# Patient Record
Sex: Female | Born: 1970 | Race: Black or African American | Hispanic: No | Marital: Single | Smoking: Never smoker
Health system: Southern US, Community
[De-identification: ages and names within clinical notes are randomized; demographics above are authoritative.]

## PROBLEM LIST (undated history)

## (undated) DIAGNOSIS — R000 Tachycardia, unspecified: Secondary | ICD-10-CM

## (undated) DIAGNOSIS — R079 Chest pain, unspecified: Secondary | ICD-10-CM

## (undated) DIAGNOSIS — I10 Essential (primary) hypertension: Secondary | ICD-10-CM

## (undated) DIAGNOSIS — K219 Gastro-esophageal reflux disease without esophagitis: Secondary | ICD-10-CM

## (undated) HISTORY — PX: REDUCTION MAMMAPLASTY: SUR839

## (undated) HISTORY — DX: Essential (primary) hypertension: I10

## (undated) HISTORY — DX: Tachycardia, unspecified: R00.0

## (undated) HISTORY — DX: Gastro-esophageal reflux disease without esophagitis: K21.9

## (undated) HISTORY — DX: Chest pain, unspecified: R07.9

---

## 1998-04-03 ENCOUNTER — Other Ambulatory Visit: Admission: RE | Admit: 1998-04-03 | Discharge: 1998-04-03 | Payer: Self-pay | Admitting: Obstetrics and Gynecology

## 1998-08-15 ENCOUNTER — Inpatient Hospital Stay (HOSPITAL_COMMUNITY): Admission: AD | Admit: 1998-08-15 | Discharge: 1998-08-15 | Payer: Self-pay | Admitting: Obstetrics & Gynecology

## 1998-08-29 ENCOUNTER — Inpatient Hospital Stay (HOSPITAL_COMMUNITY): Admission: AD | Admit: 1998-08-29 | Discharge: 1998-09-01 | Payer: Self-pay | Admitting: Obstetrics and Gynecology

## 1998-09-08 ENCOUNTER — Inpatient Hospital Stay (HOSPITAL_COMMUNITY): Admission: AD | Admit: 1998-09-08 | Discharge: 1998-09-11 | Payer: Self-pay | Admitting: Internal Medicine

## 1999-03-12 ENCOUNTER — Other Ambulatory Visit: Admission: RE | Admit: 1999-03-12 | Discharge: 1999-03-12 | Payer: Self-pay | Admitting: Gynecology

## 2001-02-20 ENCOUNTER — Encounter: Payer: Self-pay | Admitting: Family Medicine

## 2001-02-20 ENCOUNTER — Encounter: Admission: RE | Admit: 2001-02-20 | Discharge: 2001-02-20 | Payer: Self-pay | Admitting: Family Medicine

## 2001-03-27 ENCOUNTER — Encounter: Admission: RE | Admit: 2001-03-27 | Discharge: 2001-03-27 | Payer: Self-pay | Admitting: Neurosurgery

## 2001-03-27 ENCOUNTER — Encounter: Payer: Self-pay | Admitting: Neurosurgery

## 2001-04-13 ENCOUNTER — Encounter: Admission: RE | Admit: 2001-04-13 | Discharge: 2001-04-13 | Payer: Self-pay | Admitting: Neurosurgery

## 2001-04-13 ENCOUNTER — Encounter: Payer: Self-pay | Admitting: Neurosurgery

## 2001-07-26 ENCOUNTER — Encounter: Admission: RE | Admit: 2001-07-26 | Discharge: 2001-07-26 | Payer: Self-pay | Admitting: *Deleted

## 2001-07-26 ENCOUNTER — Encounter: Payer: Self-pay | Admitting: *Deleted

## 2003-04-15 ENCOUNTER — Emergency Department (HOSPITAL_COMMUNITY): Admission: AD | Admit: 2003-04-15 | Discharge: 2003-04-15 | Payer: Self-pay | Admitting: Family Medicine

## 2003-05-14 ENCOUNTER — Other Ambulatory Visit: Admission: RE | Admit: 2003-05-14 | Discharge: 2003-05-14 | Payer: Self-pay | Admitting: Obstetrics and Gynecology

## 2003-06-16 ENCOUNTER — Ambulatory Visit (HOSPITAL_BASED_OUTPATIENT_CLINIC_OR_DEPARTMENT_OTHER): Admission: RE | Admit: 2003-06-16 | Discharge: 2003-06-16 | Payer: Self-pay | Admitting: Specialist

## 2003-06-16 ENCOUNTER — Encounter (INDEPENDENT_AMBULATORY_CARE_PROVIDER_SITE_OTHER): Payer: Self-pay | Admitting: *Deleted

## 2003-06-16 ENCOUNTER — Ambulatory Visit (HOSPITAL_COMMUNITY): Admission: RE | Admit: 2003-06-16 | Discharge: 2003-06-16 | Payer: Self-pay | Admitting: Specialist

## 2004-06-04 ENCOUNTER — Encounter: Admission: RE | Admit: 2004-06-04 | Discharge: 2004-06-04 | Payer: Self-pay | Admitting: Obstetrics and Gynecology

## 2004-06-18 ENCOUNTER — Encounter: Admission: RE | Admit: 2004-06-18 | Discharge: 2004-06-18 | Payer: Self-pay | Admitting: Obstetrics and Gynecology

## 2005-07-15 ENCOUNTER — Encounter: Admission: RE | Admit: 2005-07-15 | Discharge: 2005-07-15 | Payer: Self-pay | Admitting: Obstetrics and Gynecology

## 2007-04-13 ENCOUNTER — Encounter: Payer: Self-pay | Admitting: Orthopedic Surgery

## 2007-04-15 ENCOUNTER — Encounter: Payer: Self-pay | Admitting: Orthopedic Surgery

## 2007-05-13 ENCOUNTER — Encounter: Payer: Self-pay | Admitting: Orthopedic Surgery

## 2008-12-19 ENCOUNTER — Encounter: Admission: RE | Admit: 2008-12-19 | Discharge: 2008-12-19 | Payer: Self-pay | Admitting: Obstetrics and Gynecology

## 2009-04-13 ENCOUNTER — Ambulatory Visit (HOSPITAL_COMMUNITY): Admission: RE | Admit: 2009-04-13 | Discharge: 2009-04-13 | Payer: Self-pay | Admitting: Orthopedic Surgery

## 2010-01-22 ENCOUNTER — Emergency Department (HOSPITAL_COMMUNITY): Admission: EM | Admit: 2010-01-22 | Discharge: 2010-01-22 | Payer: Self-pay | Admitting: Family Medicine

## 2010-07-30 NOTE — Op Note (Signed)
NAME:  PHALA, SCHRAEDER                           ACCOUNT NO.:  [id number]   MEDICAL RECORD NO.:  [id number]                   PATIENT TYPE:  AMB   LOCATION:  DSC                                  FACILITY:  [facility]   PHYSICIAN:  Earvin Hansen L. Shon Hough, M.D.           DATE OF BIRTH:  [birth year]   DATE OF PROCEDURE:  06/16/2003  DATE OF DISCHARGE:                                 OPERATIVE REPORT   SURGEON:  Earvin Hansen L. Shon Hough, M.D.   ASSISTANT:  Frances Nickels, R.N.   ANESTHESIA:  General.   INDICATIONS FOR PROCEDURE:  The patient demonstrates severe macromastia,  with back and shoulder pain secondary to large pendulous breasts, with  increase in accessory breast tissue.  Increased back and shoulder pain,  pitting of both shoulders, as well as intertriginous changes under both  breast areas.   PROCEDURE:  Bilateral breast reductions using the inferior pedicle  technique.   DESCRIPTION OF PROCEDURE:  Preoperatively the patient was drawn for the  inferior pedicle technique, remarking the nipple/areolar complex.  It was  back up to 20 cm from the suprasternal notch.  She then underwent general  anesthesia and was intubated orally.  A prep was done to the chest and  breast areas in a routine fashion using Betadine soap and solution, and  walled off with sterile towels and drapes, so as to make a sterile field.  Xylocaine 0.25% with epinephrine was injected, 1:400,000 concentration, a  total of 150 mL per side.  The wounds were scored with #15 blades, and then  the skin over the inferior pedicle was degrees-epithelialized with #20  blades.  Medial and lateral fatty dermal pedicles were excised down to the  underlying pectoralis major fascia.  Out laterally more tissue was removed.  Accessory breast tissue excision.  After this the new key hole area was  debulked after the flaps were stabilized and hemostasis.  They were stayed  with #3-0 Prolene, except cutaneous closures with  a run#2-0  Monocryl x2  levels, and then a running subcuticular stitch of #3-0 Monocryl.  The new  key hole area was also closed likewise with #5-0 Monocryl subcuticular.  The  wounds were drained with #10 Blake drains and fully fluted, and placed in  the depths of the wound and brought out through the lateral-most portion of  the incisions and secured with #3-0 Prolene.  Closure was done.  Steri-  Strips, Xeroform, 4 x 4's, ABD's and Hypafix tape.  The estimated blood loss  was less than 150 mL.   COMPLICATIONS:  None.                                               Yaakov Guthrie. Shon Hough, M.D.    Cathie Hoops  D:  06/16/2003  T:  06/16/2003  Job:  [id number]

## 2010-08-20 ENCOUNTER — Other Ambulatory Visit: Payer: Self-pay | Admitting: Obstetrics and Gynecology

## 2010-09-20 ENCOUNTER — Other Ambulatory Visit: Payer: Self-pay | Admitting: Obstetrics and Gynecology

## 2010-09-20 DIAGNOSIS — Z1231 Encounter for screening mammogram for malignant neoplasm of breast: Secondary | ICD-10-CM

## 2010-10-20 ENCOUNTER — Ambulatory Visit
Admission: RE | Admit: 2010-10-20 | Discharge: 2010-10-20 | Disposition: A | Discharge status: Home / Self Care | Payer: BC Managed Care – PPO | Source: Ambulatory Visit | Attending: Obstetrics and Gynecology | Admitting: Obstetrics and Gynecology

## 2010-10-20 DIAGNOSIS — Z1231 Encounter for screening mammogram for malignant neoplasm of breast: Secondary | ICD-10-CM

## 2011-06-10 ENCOUNTER — Emergency Department: Payer: Self-pay | Admitting: Unknown Physician Specialty

## 2011-06-10 LAB — CBC
MCH: 30.4 pg (ref 26.0–34.0)
MCHC: 33.8 g/dL (ref 32.0–36.0)
Platelet: 303 10*3/uL (ref 150–440)
RDW: 13 % (ref 11.5–14.5)
WBC: 5.7 10*3/uL (ref 3.6–11.0)

## 2011-06-10 LAB — URINALYSIS, COMPLETE
Bilirubin,UR: NEGATIVE
RBC,UR: 4 /HPF (ref 0–5)
Specific Gravity: 1.016 (ref 1.003–1.030)
Squamous Epithelial: 3

## 2011-06-10 LAB — COMPREHENSIVE METABOLIC PANEL
Alkaline Phosphatase: 49 U/L — ABNORMAL LOW (ref 50–136)
BUN: 9 mg/dL (ref 7–18)
Calcium, Total: 8.5 mg/dL (ref 8.5–10.1)
Creatinine: 0.64 mg/dL (ref 0.60–1.30)
EGFR (African American): >60
Potassium: 3.4 mmol/L — ABNORMAL LOW (ref 3.5–5.1)
SGOT(AST): 13 U/L — ABNORMAL LOW (ref 15–37)
Sodium: 137 mmol/L (ref 136–145)
Total Protein: 8 g/dL (ref 6.4–8.2)

## 2011-06-10 LAB — CK TOTAL AND CKMB (NOT AT ARMC)
CK, Total: 47 U/L (ref 21–215)
CK-MB: 0.6 ng/mL (ref 0.5–3.6)

## 2011-06-10 LAB — TROPONIN I: Troponin-I: <0.02 ng/mL

## 2011-06-11 LAB — DRUG SCREEN, URINE
Amphetamines, Ur Screen: NEGATIVE (ref ?–1000)
Cannabinoid 50 Ng, Ur ~~LOC~~: NEGATIVE (ref ?–50)
Cocaine Metabolite,Ur ~~LOC~~: NEGATIVE (ref ?–300)
MDMA (Ecstasy)Ur Screen: NEGATIVE (ref ?–500)

## 2011-06-11 LAB — CK TOTAL AND CKMB (NOT AT ARMC): CK-MB: <0.5 ng/mL — ABNORMAL LOW (ref 0.5–3.6)

## 2011-06-12 LAB — URINE CULTURE

## 2011-06-16 LAB — CULTURE, BLOOD (SINGLE)

## 2012-10-17 ENCOUNTER — Other Ambulatory Visit: Payer: Self-pay

## 2012-10-17 DIAGNOSIS — Z1231 Encounter for screening mammogram for malignant neoplasm of breast: Secondary | ICD-10-CM

## 2012-10-30 ENCOUNTER — Ambulatory Visit
Admission: RE | Admit: 2012-10-30 | Discharge: 2012-10-30 | Disposition: A | Discharge status: Home / Self Care | Payer: BC Managed Care – PPO | Source: Ambulatory Visit

## 2012-10-30 DIAGNOSIS — Z1231 Encounter for screening mammogram for malignant neoplasm of breast: Secondary | ICD-10-CM

## 2014-07-06 NOTE — Consult Note (Signed)
PATIENT NAME:  Sheila Baldwin, Sheila Baldwin MR#:  [id number] DATE OF BIRTH:  [birth year]  DATE OF CONSULTATION:  06/11/2011  REFERRING PHYSICIAN:  Dr. Margarita Grizzle  CONSULTING PHYSICIAN:  Stephanie Acre, MD  PRIMARY CARE PHYSICIAN: None.   CHIEF COMPLAINT: "Chest pain with inspiration".   HISTORY OF PRESENT ILLNESS: This is a 44 year old female with past medical history significant only for chronic back pain secondary to herniated disk who presented with acute onset of chest pain that started over the last 24 hours. The patient states that over the past couple of days to weeks she has been feeling fine. She has not had any kind of chest pain symptoms in the past before until last night and during the day today she started having some chest pains only with inspiration that would come and go. It started getting worse towards the evening especially with deep inspiration and with coughing. She tried to cough. The patient stated the pain is somewhat sharp especially with pushing down on her chest with deep inspiration kind of radiating to her back. In the ED she had a D-dimer that was mildly elevated 1.1 and a CT scan of the chest with contrast that showed no acute PE or any thoracic dissection ascending or descending. She denies any sick contacts. She does have subjective fevers and chills. No significant weight loss. She denies any family history of MI or coronary artery disease. Her mother does have diabetes but no other history of hypertension or significant risk factors for acute coronary syndrome, myocardial infarction, or angina. The patient also stated that she's been having body aches all over for the last 24 hours. 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 She is not sure if she's been exposed to any of her patients that may have had some type of viral syndrome. Hospitalist services were consulted for further recommendations and management.   PAST MEDICAL HISTORY:  Chronic back pain secondary to herniated disk.  MEDICATIONS: Flexeril. The patient cannot remember the dose. She doesn't take it frequently.  PAST SURGICAL HISTORY: None.  ALLERGIES: None.  LAST HOSPITALIZATION: None.  FAMILY HISTORY: Mother with diabetes.   SOCIAL HISTORY: Nonsmoker, nondrinker. No drugs.  REVIEW OF SYSTEMS: Positive fever, fatigue, weakness, and generalized body aches. No weight gain or weight loss. EYES: No blurred vision, double vision, or pain. ENT: No tinnitus, ear pain, hearing loss, postnasal drip. No cough. RESPIRATORY: No cough, no wheeze, no hemoptysis. CARDIOVASCULAR: Positive pleuritic chest pain. No syncope, high blood pressure, arrhythmia. GI: Mild nausea. No vomiting, diarrhea, abdominal pain, gastroesophageal reflux disease. GU: No dysuria or hematuria. ENDOCRINE: No polyuria, nocturia, thyroid problems.  HEME/LYMPH: No anemia, easy bruising, or swollen glands. MUSCULOSKELETAL: Chronic low back pain secondary to herniated disk, otherwise, generalized body aches. NEUROLOGIC: No numbness, weakness, dysarthria, epilepsy, tremor, or vertigo. PSYCH: No anxiety, insomnia, ADD, OCD, bipolar.   PHYSICAL EXAMINATION:  VITAL SIGNS: Temperature 100.2, pulse 125, respirations 20, blood pressure 129/81.   GENERAL APPEARANCE: Well developed, well nourished female laying in bed in no acute respiratory distress.   HEENT: Pupils equal, round, and reactive to light and accommodation. Extraocular movements intact. No scleral icterus. No difficulty hearing. TMs are intact. Mild pharyngeal erythema is noted. Mucous membranes are mildly dry. Dentition is good.   NECK: No thyroid enlargement. No tenderness. No nodules. Neck is supple, nontender. No JVD or carotid bruits.  RESPIRATORY: Clear to auscultation bilaterally. No rales, rhonchi, crackles, diminished  breath sounds. No labored breathing. No use of accessory muscles. During auscultation respiratory effort is somewhat poor  secondary to pain on deep inspiration. Reproducible chest pain with substernal palpation.  CARDIOVASCULAR: Regular rate, regular rhythm. No murmurs, rubs, or gallops. No pedal edema. No thrills. Again, reproducible chest pain.  ABDOMEN: Soft, nontender, and nondistended. Positive bowel sounds.  MUSCULOSKELETAL: 5 out of 5 strength in bilateral upper and lower extremities. No cyanosis. No degenerative joint disease.   SKIN: No rash, lesions, erythema, nodules. Skin is warm and dry.  LYMPH: No adenopathy noted in the cervical, axilla, or supraclavicular regions.  NEUROLOGIC: Cranial nerves II through XII intact. Deep tendon reflexes intact. No dysarthria, aphasia, dysphagia, or contractures.   PSYCH: Alert and oriented, cooperative, good judgment.   LABORATORY, DIAGNOSTIC, AND RADIOLOGICAL DATA: Sodium 137, potassium 3.4, bicarb 25, chloride 102, creatinine 0.64, BUN 9, glucose 115. First set of cardiac enzymes negative. AST 13, ALT 18. White blood cell count 5.7, hemoglobin 13.7, hematocrit 40.5, platelet count 303. D-dimer 1.41. Urinalysis shows leukocyte esterase negative, protein negative, nitrite negative. She did have a CT scan of the chest that showed no evidence of pulmonary embolism. Lungs are clear. No thoracic aortic dissection. She also had a chest x-ray that showed no acute cardiopulmonary disease or process. EKG shows sinus rhythm with normal sinus tachycardia of about 120. No ST-T wave changes or abnormalities.  ASSESSMENT AND PLAN: This is a 44 year old female with no significant past medical history except for chronic low back pain who presents with chest pain on inspiration for the past 24 hours. 1. Chest pain, most likely pleuritis, viral in nature given history and physical exam findings. Chest pain is reproducible with deep inspiration and substernal palpation. Recommend supportive care with adequate hydration. Recommend ibuprofen 800 mg q.8 hours x2 to 3 days. Recommend one  more set of cardiac enzymes since it's been about six hours since her last set. The patient was advised that if her symptoms become worse or not improving, she should return to the ED for further workup and evaluation. The patient is also advised to follow-up with a primary care physician or establish care with a primary care physician if she does not have one. The patient is currently in agreement with the above plan. 2. Fever, low-grade, most likely secondary to viral infection given the generalized body aches. Will continue with management as stated above. Continue with supportive care with fluids. 3. Tachycardia related to fever and underlying pleuritis and viral infection. Continue with supportive care. 4. Nausea related to #1. Continue with supportive care. 5. Low back pain, chronic, related to herniated disk. Continue with Flexeril p.r.n.   Again, the patient was advised to establish care with primary care physician if she does not have one. The patient verbalized understanding of above treatment and management plan. Case was discussed with ED attending, Dr. Enedina FinnerGoli, who agrees with the above plan. Will check another set of cardiac enzymes, give the patient a dose of 800 mg of ibuprofen here in the ED.   CODE STATUS: The patient is a FULL CODE.  TIME SPENT DICTATING AND EVALUATING THE PATIENT: 35 minutes.  ____________________________ Stephanie AcreVishal Rosalita Carey, MD vm:drc D: 06/11/2011 01:06:00 ET T: 06/11/2011 12:35:44 ET JOB#: [id number]  cc: Stephanie AcreVishal Olinda Nola, MD, <Dictator> Stephanie AcreVISHAL Najiyah Paris MD ELECTRONICALLY SIGNED 06/11/2011 17:43

## 2014-07-06 NOTE — Consult Note (Signed)
Brief Consult Note: Diagnosis: Pleuritis.   Comments: 44 yo female with no sig PMHx presenting with chest pain with respiration for the past 24 hrs.  1. Chest Pain - most likely pleuritis, viral in nature - chest reproducbile with deep inspiration and substernal palpation - recommend supportive care with adequate hydration - recommend ibuprofen 800mg  q8hrs x 2-3 days - recommend 1 more set of cardiac enzymes - patient advised that if symptoms are worst or not improving to return to the ED  2. Fever - low grade - most likely viral, cont with management as stated above - cont with supportive care  3. Tachycardia - related to #1  4. Nausea - related to #1, cont with supportive care  5. low back pain - chronic, related to herniated disc - cont with prn flexeril  patient advised to est care with a PMD Patient verbalized understanding of above treatment and management plan.  Case discussed with ED Attending (Dr. Enedina FinnerGoli), who agrees with above plan.  Time spent evaluating patient = 35 minutes FULL CODE PMD - None  ZOX#[id number]#[id number].  Electronic Signatures: Stephanie AcreMungal, Edel Rivero (MD)  (Signed 30-Mar-13 01:06)  Authored: Brief Consult Note   Last Updated: 30-Mar-13 01:06 by Stephanie AcreMungal, Miriam Liles (MD)

## 2015-06-11 ENCOUNTER — Other Ambulatory Visit: Payer: Self-pay | Admitting: Family Medicine

## 2015-06-11 DIAGNOSIS — N644 Mastodynia: Secondary | ICD-10-CM

## 2015-06-11 DIAGNOSIS — N6310 Unspecified lump in the right breast, unspecified quadrant: Secondary | ICD-10-CM

## 2015-06-18 ENCOUNTER — Ambulatory Visit
Admission: RE | Admit: 2015-06-18 | Discharge: 2015-06-18 | Disposition: A | Discharge status: Home / Self Care | Payer: Managed Care, Other (non HMO) | Source: Ambulatory Visit | Attending: Family Medicine | Admitting: Family Medicine

## 2015-06-18 DIAGNOSIS — N6310 Unspecified lump in the right breast, unspecified quadrant: Secondary | ICD-10-CM

## 2015-06-18 DIAGNOSIS — N644 Mastodynia: Secondary | ICD-10-CM

## 2015-11-27 ENCOUNTER — Other Ambulatory Visit: Payer: Self-pay | Admitting: Family Medicine

## 2015-11-27 DIAGNOSIS — N6320 Unspecified lump in the left breast, unspecified quadrant: Secondary | ICD-10-CM

## 2015-12-16 ENCOUNTER — Ambulatory Visit
Admission: RE | Admit: 2015-12-16 | Discharge: 2015-12-16 | Disposition: A | Discharge status: Home / Self Care | Payer: Managed Care, Other (non HMO) | Source: Ambulatory Visit | Attending: Family Medicine | Admitting: Family Medicine

## 2015-12-16 DIAGNOSIS — N6320 Unspecified lump in the left breast, unspecified quadrant: Secondary | ICD-10-CM

## 2016-06-24 ENCOUNTER — Other Ambulatory Visit: Payer: Self-pay | Admitting: Family Medicine

## 2016-06-24 DIAGNOSIS — N6320 Unspecified lump in the left breast, unspecified quadrant: Secondary | ICD-10-CM

## 2016-06-30 ENCOUNTER — Other Ambulatory Visit: Payer: Managed Care, Other (non HMO)

## 2016-07-01 ENCOUNTER — Ambulatory Visit
Admission: RE | Admit: 2016-07-01 | Discharge: 2016-07-01 | Disposition: A | Discharge status: Home / Self Care | Payer: Managed Care, Other (non HMO) | Source: Ambulatory Visit | Attending: Family Medicine | Admitting: Family Medicine

## 2016-07-01 DIAGNOSIS — N6320 Unspecified lump in the left breast, unspecified quadrant: Secondary | ICD-10-CM

## 2016-12-19 DIAGNOSIS — M5416 Radiculopathy, lumbar region: Secondary | ICD-10-CM | POA: Diagnosis not present

## 2017-01-04 DIAGNOSIS — M5416 Radiculopathy, lumbar region: Secondary | ICD-10-CM | POA: Diagnosis not present

## 2017-01-25 ENCOUNTER — Other Ambulatory Visit: Payer: Self-pay | Admitting: Family Medicine

## 2017-01-25 DIAGNOSIS — R109 Unspecified abdominal pain: Secondary | ICD-10-CM | POA: Diagnosis not present

## 2017-01-25 DIAGNOSIS — K279 Peptic ulcer, site unspecified, unspecified as acute or chronic, without hemorrhage or perforation: Secondary | ICD-10-CM | POA: Diagnosis not present

## 2017-01-25 DIAGNOSIS — K219 Gastro-esophageal reflux disease without esophagitis: Secondary | ICD-10-CM | POA: Diagnosis not present

## 2017-01-26 ENCOUNTER — Ambulatory Visit
Admission: RE | Admit: 2017-01-26 | Discharge: 2017-01-26 | Disposition: A | Discharge status: Home / Self Care | Payer: 59 | Source: Ambulatory Visit | Attending: Family Medicine | Admitting: Family Medicine

## 2017-01-26 DIAGNOSIS — R109 Unspecified abdominal pain: Secondary | ICD-10-CM

## 2017-01-26 DIAGNOSIS — K8020 Calculus of gallbladder without cholecystitis without obstruction: Secondary | ICD-10-CM | POA: Diagnosis not present

## 2017-01-27 DIAGNOSIS — Z6832 Body mass index (BMI) 32.0-32.9, adult: Secondary | ICD-10-CM | POA: Diagnosis not present

## 2017-01-27 DIAGNOSIS — K8020 Calculus of gallbladder without cholecystitis without obstruction: Secondary | ICD-10-CM | POA: Diagnosis not present

## 2017-01-27 DIAGNOSIS — K219 Gastro-esophageal reflux disease without esophagitis: Secondary | ICD-10-CM | POA: Diagnosis not present

## 2017-01-27 DIAGNOSIS — K279 Peptic ulcer, site unspecified, unspecified as acute or chronic, without hemorrhage or perforation: Secondary | ICD-10-CM | POA: Diagnosis not present

## 2017-01-27 DIAGNOSIS — R109 Unspecified abdominal pain: Secondary | ICD-10-CM | POA: Diagnosis not present

## 2017-02-08 DIAGNOSIS — K219 Gastro-esophageal reflux disease without esophagitis: Secondary | ICD-10-CM | POA: Diagnosis not present

## 2017-02-08 DIAGNOSIS — K8020 Calculus of gallbladder without cholecystitis without obstruction: Secondary | ICD-10-CM | POA: Diagnosis not present

## 2017-02-08 DIAGNOSIS — I10 Essential (primary) hypertension: Secondary | ICD-10-CM | POA: Diagnosis not present

## 2017-02-17 DIAGNOSIS — K8020 Calculus of gallbladder without cholecystitis without obstruction: Secondary | ICD-10-CM | POA: Diagnosis not present

## 2017-03-27 ENCOUNTER — Other Ambulatory Visit: Payer: Self-pay | Admitting: General Surgery

## 2017-03-27 DIAGNOSIS — K8010 Calculus of gallbladder with chronic cholecystitis without obstruction: Secondary | ICD-10-CM | POA: Diagnosis not present

## 2017-09-19 DIAGNOSIS — Z0000 Encounter for general adult medical examination without abnormal findings: Secondary | ICD-10-CM | POA: Diagnosis not present

## 2017-09-19 DIAGNOSIS — I10 Essential (primary) hypertension: Secondary | ICD-10-CM | POA: Diagnosis not present

## 2017-09-19 DIAGNOSIS — N946 Dysmenorrhea, unspecified: Secondary | ICD-10-CM | POA: Diagnosis not present

## 2017-10-05 ENCOUNTER — Other Ambulatory Visit: Payer: Self-pay | Admitting: Family Medicine

## 2017-10-05 DIAGNOSIS — N6320 Unspecified lump in the left breast, unspecified quadrant: Secondary | ICD-10-CM

## 2017-10-05 DIAGNOSIS — Z6831 Body mass index (BMI) 31.0-31.9, adult: Secondary | ICD-10-CM | POA: Diagnosis not present

## 2017-10-05 DIAGNOSIS — Z0000 Encounter for general adult medical examination without abnormal findings: Secondary | ICD-10-CM | POA: Diagnosis not present

## 2017-10-05 DIAGNOSIS — Z23 Encounter for immunization: Secondary | ICD-10-CM | POA: Diagnosis not present

## 2017-10-24 ENCOUNTER — Ambulatory Visit: Payer: 59

## 2017-10-24 ENCOUNTER — Ambulatory Visit
Admission: RE | Admit: 2017-10-24 | Discharge: 2017-10-24 | Disposition: A | Discharge status: Home / Self Care | Payer: 59 | Source: Ambulatory Visit | Attending: Family Medicine | Admitting: Family Medicine

## 2017-10-24 DIAGNOSIS — N6320 Unspecified lump in the left breast, unspecified quadrant: Secondary | ICD-10-CM

## 2017-10-24 DIAGNOSIS — N6321 Unspecified lump in the left breast, upper outer quadrant: Secondary | ICD-10-CM | POA: Diagnosis not present

## 2017-10-24 DIAGNOSIS — R922 Inconclusive mammogram: Secondary | ICD-10-CM | POA: Diagnosis not present

## 2017-12-06 DIAGNOSIS — Z136 Encounter for screening for cardiovascular disorders: Secondary | ICD-10-CM | POA: Diagnosis not present

## 2017-12-06 DIAGNOSIS — Z6831 Body mass index (BMI) 31.0-31.9, adult: Secondary | ICD-10-CM | POA: Diagnosis not present

## 2017-12-06 DIAGNOSIS — I10 Essential (primary) hypertension: Secondary | ICD-10-CM | POA: Diagnosis not present

## 2017-12-06 DIAGNOSIS — K219 Gastro-esophageal reflux disease without esophagitis: Secondary | ICD-10-CM | POA: Diagnosis not present

## 2018-02-26 DIAGNOSIS — I10 Essential (primary) hypertension: Secondary | ICD-10-CM | POA: Diagnosis not present

## 2018-02-26 DIAGNOSIS — K219 Gastro-esophageal reflux disease without esophagitis: Secondary | ICD-10-CM | POA: Diagnosis not present

## 2018-02-26 DIAGNOSIS — Z6832 Body mass index (BMI) 32.0-32.9, adult: Secondary | ICD-10-CM | POA: Diagnosis not present

## 2018-02-26 DIAGNOSIS — E782 Mixed hyperlipidemia: Secondary | ICD-10-CM | POA: Diagnosis not present

## 2018-02-26 DIAGNOSIS — Z23 Encounter for immunization: Secondary | ICD-10-CM | POA: Diagnosis not present

## 2018-03-15 DIAGNOSIS — M5416 Radiculopathy, lumbar region: Secondary | ICD-10-CM | POA: Diagnosis not present

## 2018-03-26 DIAGNOSIS — J019 Acute sinusitis, unspecified: Secondary | ICD-10-CM | POA: Diagnosis not present

## 2018-03-26 DIAGNOSIS — J069 Acute upper respiratory infection, unspecified: Secondary | ICD-10-CM | POA: Diagnosis not present

## 2018-03-26 DIAGNOSIS — Z6832 Body mass index (BMI) 32.0-32.9, adult: Secondary | ICD-10-CM | POA: Diagnosis not present

## 2018-07-10 DIAGNOSIS — Z20828 Contact with and (suspected) exposure to other viral communicable diseases: Secondary | ICD-10-CM | POA: Diagnosis not present

## 2018-07-10 DIAGNOSIS — I10 Essential (primary) hypertension: Secondary | ICD-10-CM | POA: Diagnosis not present

## 2018-07-13 DIAGNOSIS — Z20828 Contact with and (suspected) exposure to other viral communicable diseases: Secondary | ICD-10-CM | POA: Diagnosis not present

## 2018-07-13 DIAGNOSIS — J069 Acute upper respiratory infection, unspecified: Secondary | ICD-10-CM | POA: Diagnosis not present

## 2018-07-13 DIAGNOSIS — U071 COVID-19: Secondary | ICD-10-CM | POA: Diagnosis not present

## 2018-09-05 ENCOUNTER — Other Ambulatory Visit: Payer: Self-pay | Admitting: *Deleted

## 2018-09-05 DIAGNOSIS — Z20822 Contact with and (suspected) exposure to covid-19: Secondary | ICD-10-CM

## 2018-09-09 LAB — NOVEL CORONAVIRUS, NAA: SARS-CoV-2, NAA: NOT DETECTED

## 2018-09-28 DIAGNOSIS — M5416 Radiculopathy, lumbar region: Secondary | ICD-10-CM | POA: Diagnosis not present

## 2018-10-02 DIAGNOSIS — I10 Essential (primary) hypertension: Secondary | ICD-10-CM | POA: Diagnosis not present

## 2018-10-02 DIAGNOSIS — K219 Gastro-esophageal reflux disease without esophagitis: Secondary | ICD-10-CM | POA: Diagnosis not present

## 2018-10-02 DIAGNOSIS — E782 Mixed hyperlipidemia: Secondary | ICD-10-CM | POA: Diagnosis not present

## 2018-10-18 DIAGNOSIS — M5416 Radiculopathy, lumbar region: Secondary | ICD-10-CM | POA: Diagnosis not present

## 2018-11-24 DIAGNOSIS — Z20828 Contact with and (suspected) exposure to other viral communicable diseases: Secondary | ICD-10-CM | POA: Diagnosis not present

## 2018-12-25 DIAGNOSIS — Z1159 Encounter for screening for other viral diseases: Secondary | ICD-10-CM | POA: Diagnosis not present

## 2018-12-25 DIAGNOSIS — Z0000 Encounter for general adult medical examination without abnormal findings: Secondary | ICD-10-CM | POA: Diagnosis not present

## 2018-12-27 DIAGNOSIS — Z0000 Encounter for general adult medical examination without abnormal findings: Secondary | ICD-10-CM | POA: Diagnosis not present

## 2018-12-27 DIAGNOSIS — Z6832 Body mass index (BMI) 32.0-32.9, adult: Secondary | ICD-10-CM | POA: Diagnosis not present

## 2019-01-01 DIAGNOSIS — M5416 Radiculopathy, lumbar region: Secondary | ICD-10-CM | POA: Diagnosis not present

## 2019-01-25 ENCOUNTER — Other Ambulatory Visit: Payer: Self-pay | Admitting: Family Medicine

## 2019-01-25 DIAGNOSIS — Z1231 Encounter for screening mammogram for malignant neoplasm of breast: Secondary | ICD-10-CM

## 2019-02-04 DIAGNOSIS — Z20828 Contact with and (suspected) exposure to other viral communicable diseases: Secondary | ICD-10-CM | POA: Diagnosis not present

## 2019-02-05 ENCOUNTER — Other Ambulatory Visit: Payer: Self-pay

## 2019-02-05 DIAGNOSIS — I10 Essential (primary) hypertension: Secondary | ICD-10-CM | POA: Diagnosis not present

## 2019-02-05 DIAGNOSIS — E782 Mixed hyperlipidemia: Secondary | ICD-10-CM | POA: Diagnosis not present

## 2019-02-05 DIAGNOSIS — Z20828 Contact with and (suspected) exposure to other viral communicable diseases: Secondary | ICD-10-CM | POA: Diagnosis not present

## 2019-02-05 DIAGNOSIS — K219 Gastro-esophageal reflux disease without esophagitis: Secondary | ICD-10-CM | POA: Diagnosis not present

## 2019-02-05 DIAGNOSIS — Z20822 Contact with and (suspected) exposure to covid-19: Secondary | ICD-10-CM

## 2019-02-07 LAB — NOVEL CORONAVIRUS, NAA: SARS-CoV-2, NAA: NOT DETECTED

## 2019-03-21 ENCOUNTER — Ambulatory Visit
Admission: RE | Admit: 2019-03-21 | Discharge: 2019-03-21 | Disposition: A | Discharge status: Home / Self Care | Payer: 59 | Source: Ambulatory Visit | Attending: Family Medicine | Admitting: Family Medicine

## 2019-03-21 ENCOUNTER — Other Ambulatory Visit: Payer: Self-pay

## 2019-03-21 DIAGNOSIS — Z1231 Encounter for screening mammogram for malignant neoplasm of breast: Secondary | ICD-10-CM

## 2019-03-22 DIAGNOSIS — M4606 Spinal enthesopathy, lumbar region: Secondary | ICD-10-CM | POA: Diagnosis not present

## 2019-03-22 DIAGNOSIS — M9902 Segmental and somatic dysfunction of thoracic region: Secondary | ICD-10-CM | POA: Diagnosis not present

## 2019-03-22 DIAGNOSIS — M9904 Segmental and somatic dysfunction of sacral region: Secondary | ICD-10-CM | POA: Diagnosis not present

## 2019-03-22 DIAGNOSIS — M62830 Muscle spasm of back: Secondary | ICD-10-CM | POA: Diagnosis not present

## 2019-03-22 DIAGNOSIS — M609 Myositis, unspecified: Secondary | ICD-10-CM | POA: Diagnosis not present

## 2019-03-22 DIAGNOSIS — M5117 Intervertebral disc disorders with radiculopathy, lumbosacral region: Secondary | ICD-10-CM | POA: Diagnosis not present

## 2019-03-22 DIAGNOSIS — Z20828 Contact with and (suspected) exposure to other viral communicable diseases: Secondary | ICD-10-CM | POA: Diagnosis not present

## 2019-04-18 IMAGING — [facility] DIGITAL DIAGNOSTIC BILATERAL MAMMOGRAM WITH TOMO AND CAD
8 series · 8 of 24 positions shown · non-contrast
Comparison: Previous exam(s).

CLINICAL DATA: Patient for short-term follow-up by benign left
breast mass.

EXAM:
DIGITAL DIAGNOSTIC BILATERAL MAMMOGRAM WITH CAD AND TOMO

[R CC synth-2D]
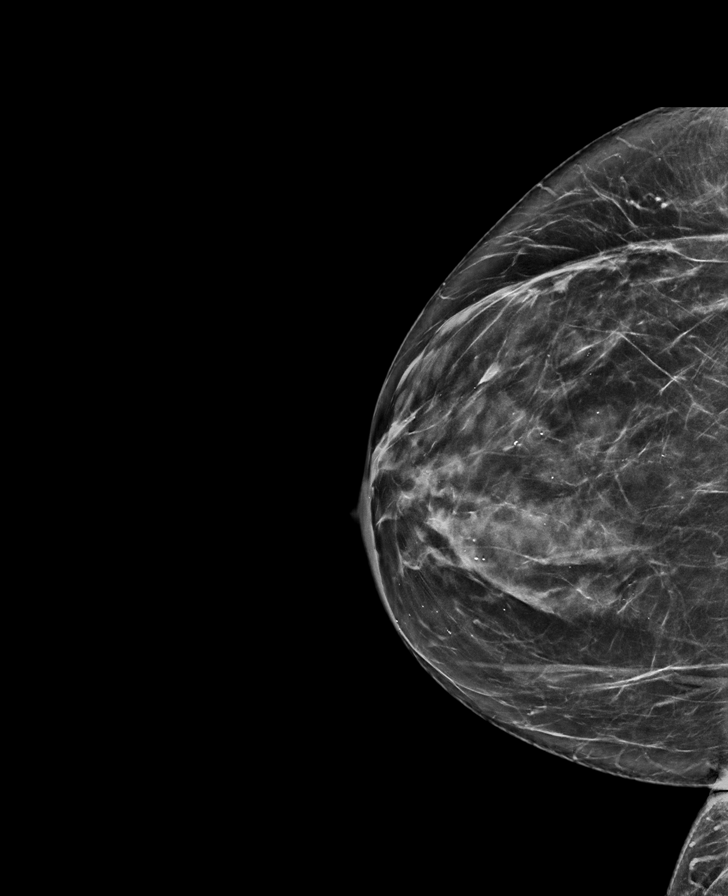

[L CC synth-2D]
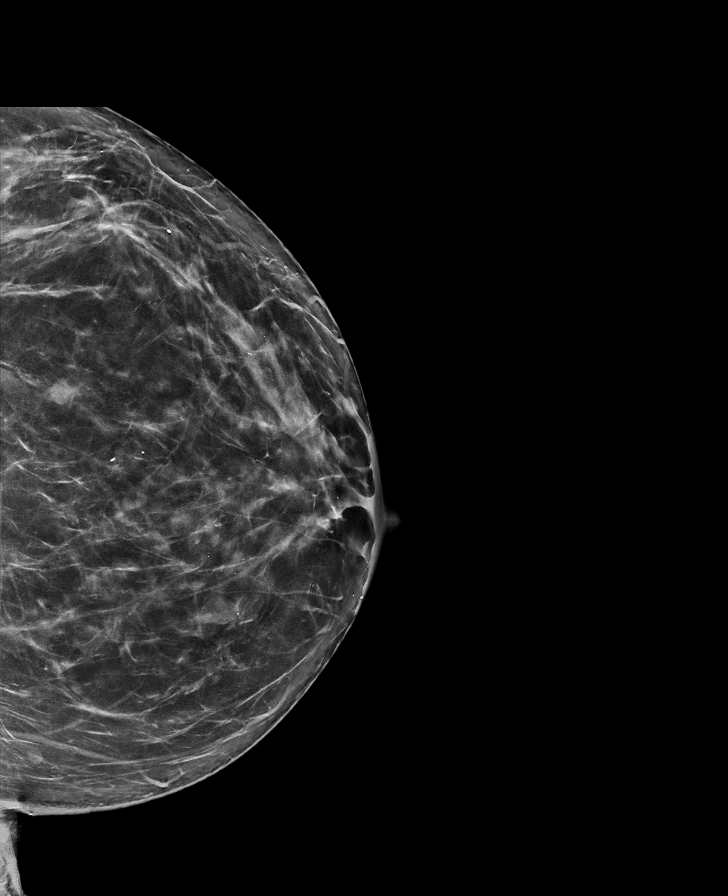

[R MLO synth-2D]
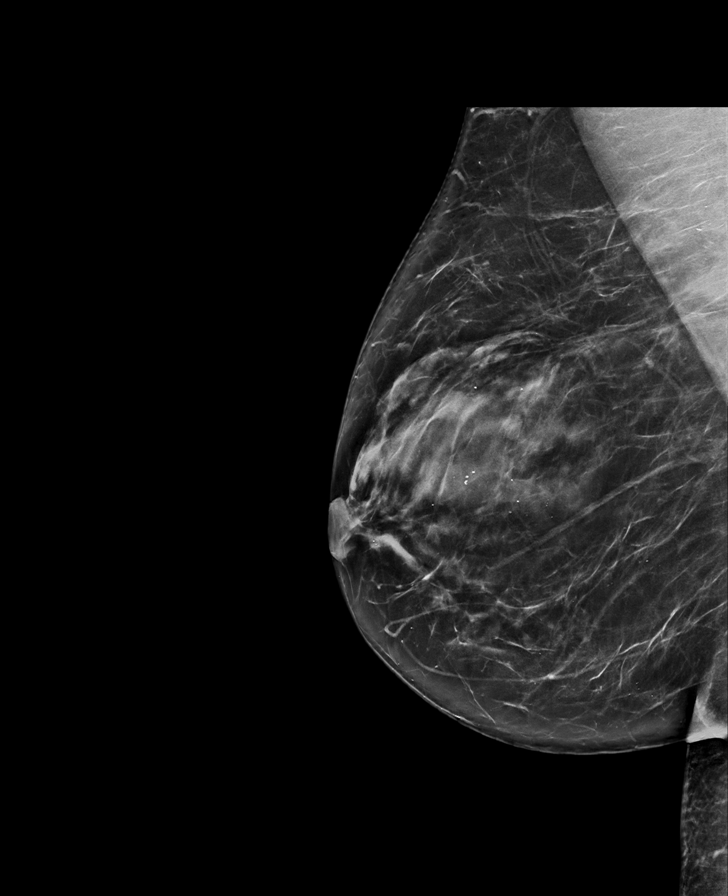

[L MLO synth-2D]
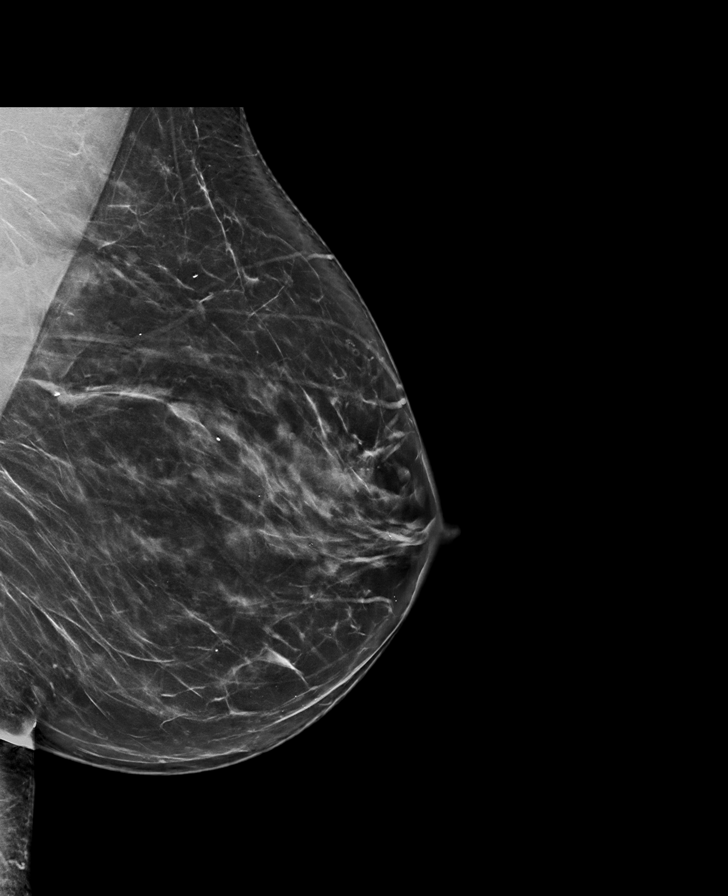

[L MLO tomo · tomo slice 45/89.0]
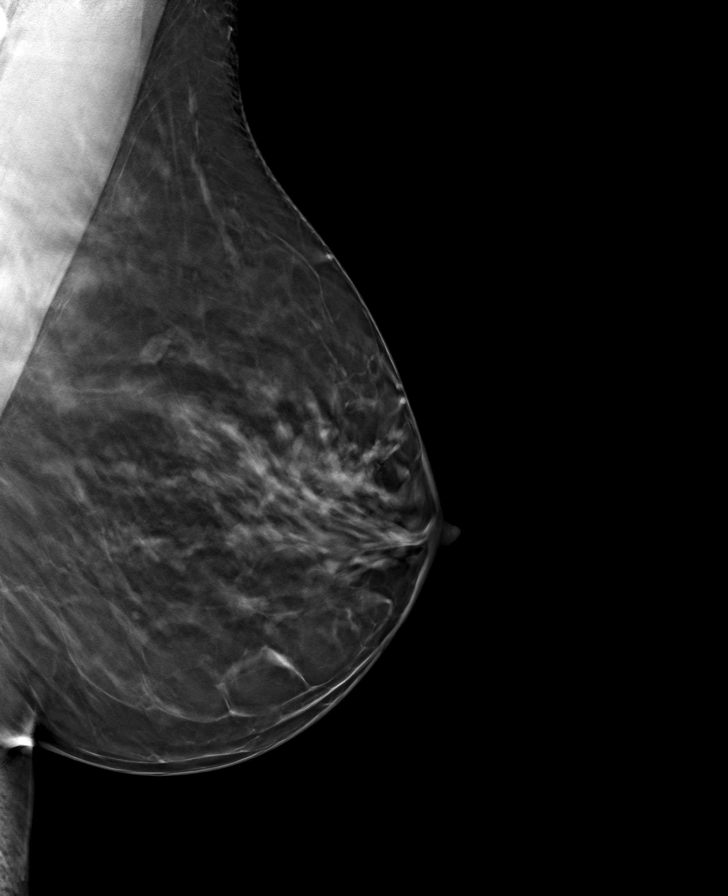

[L CC tomo · tomo slice 39/78.0]
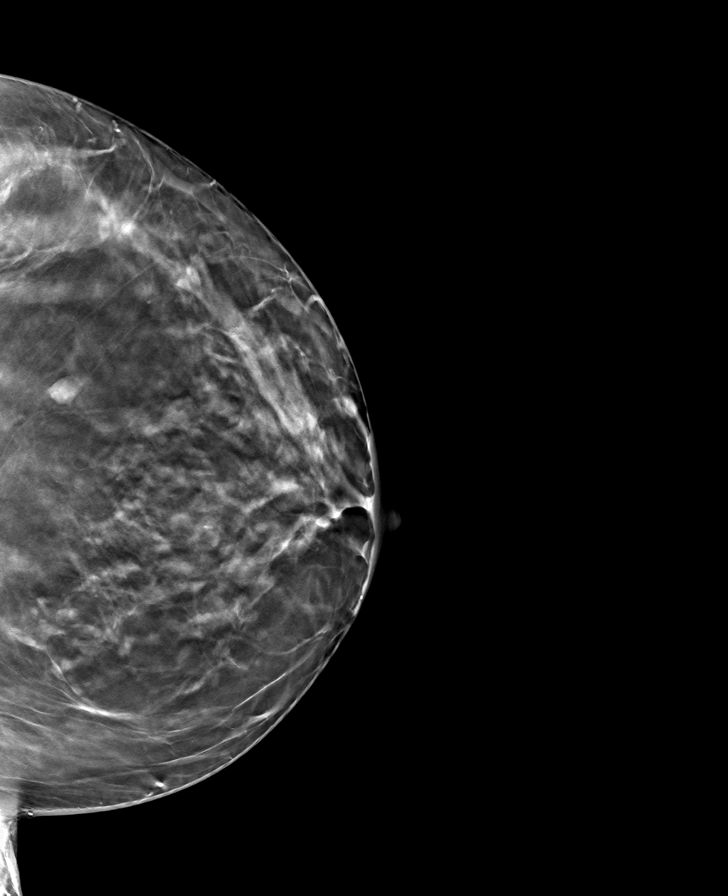

[R MLO tomo · tomo slice 41/82.0]
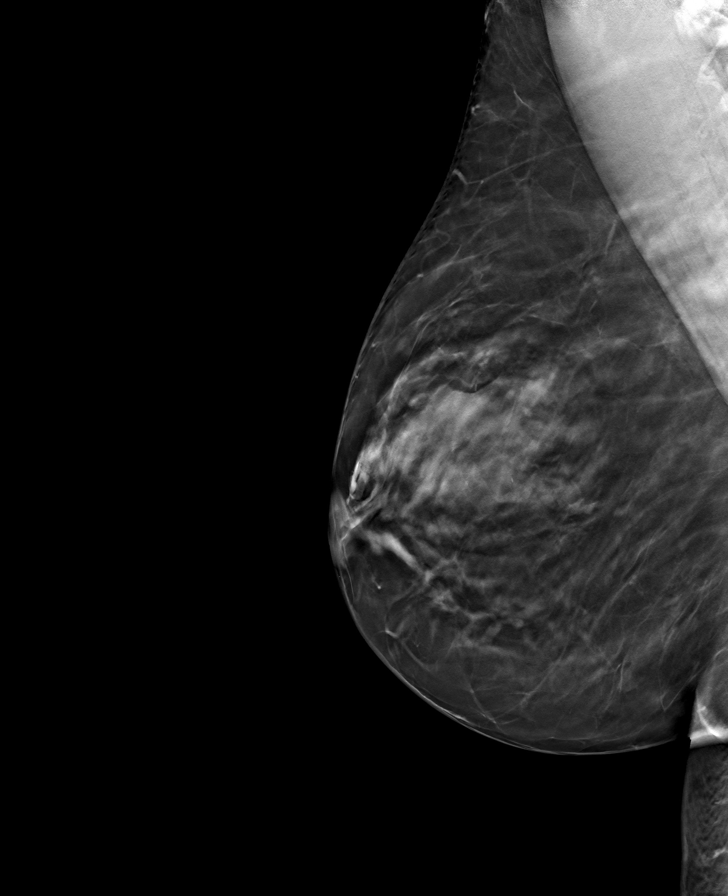

[R CC tomo · tomo slice 38/75.0]
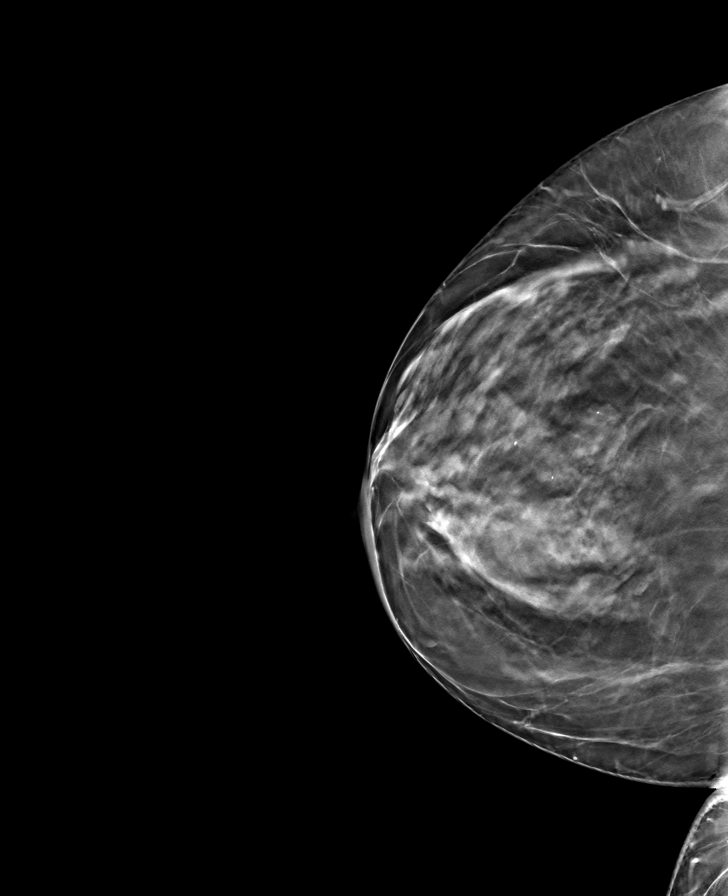

[8 of 24 positions shown; findings below may reference images not displayed]

ACR Breast Density Category c: The breast tissue is heterogeneously
dense, which may obscure small masses.
FINDINGS: Stable oval circumscribed mass within the upper-outer left breast,
compatible with benign process given stability over time. No new
masses, calcifications or nonsurgical distortion identified within
either breast.

Mammographic images were processed with CAD.
IMPRESSION: No mammographic evidence for malignancy.

RECOMMENDATION:
Screening mammogram in one year.(Code:PM-2-OB2)

I have discussed the findings and recommendations with the patient.
Results were also provided in writing at the conclusion of the
visit. If applicable, a reminder letter will be sent to the patient
regarding the next appointment.

BI-RADS CATEGORY  2: Benign.

## 2019-07-16 DIAGNOSIS — M5416 Radiculopathy, lumbar region: Secondary | ICD-10-CM | POA: Diagnosis not present

## 2019-07-19 ENCOUNTER — Other Ambulatory Visit: Payer: Self-pay | Admitting: Physical Medicine and Rehabilitation

## 2019-07-19 DIAGNOSIS — M545 Low back pain, unspecified: Secondary | ICD-10-CM

## 2019-08-14 ENCOUNTER — Other Ambulatory Visit: Payer: Self-pay | Admitting: *Deleted

## 2019-08-14 NOTE — Patient Outreach (Signed)
[facility]) Care Management  08/14/2019  Sheila Baldwin Mar 14, 1971 [id number]   Subjective: Telephone call to patient's mobile number, no answer, left HIPAA compliant voicemail message, and requested call back.    Objective: Per KPN (Knowledge Performance Now, point of care tool) and chart review, patient has had no recent hospitalizations or ED visits.   Patient has a history of calculus of gallbladder and hypertension (per [place]).       Assessment: Received [employer] Hypertension Initiative referral on 07/09/2019.   Referral source: [employer]na.   Referral reason: " As a benefit of your [employer] plan, you have been chosen to participate in a blood pressure program. You will receive a blood pressure monitor in the mail with information to help you better manage your health."    Screening  follow up pending patient contact.      Plan: RNCM will send unsuccessful outreach  letter, [facility] pamphlet, will call patient for 2nd telephone outreach attempt within 4 business days, screening follow up, and will proceed with case closure within 10 business days if no return call, after 4th unsuccessful outreach call.     Sydni Elizarraraz H. Gardiner Barefoot, BSN, CCM [facility] Care Management [facility] Telephonic CM Phone: [phone] Fax: [phone]

## 2019-08-19 ENCOUNTER — Other Ambulatory Visit: Payer: Self-pay

## 2019-08-19 ENCOUNTER — Other Ambulatory Visit: Payer: Self-pay | Admitting: *Deleted

## 2019-08-19 ENCOUNTER — Ambulatory Visit
Admission: RE | Admit: 2019-08-19 | Discharge: 2019-08-19 | Disposition: A | Discharge status: Home / Self Care | Payer: 59 | Source: Ambulatory Visit | Attending: Physical Medicine and Rehabilitation | Admitting: Physical Medicine and Rehabilitation

## 2019-08-19 DIAGNOSIS — M545 Low back pain, unspecified: Secondary | ICD-10-CM

## 2019-08-19 NOTE — Patient Outreach (Signed)
[facility]) Care Management  08/19/2019  ALIE MOUDY 09-10-1970 [id number]   Subjective: Telephone call to patient's mobile number, no answer, left HIPAA compliant voicemail message, and requested call back.    Objective: Per KPN (Knowledge Performance Now, point of care tool) and chart review, patient has had no recent hospitalizations or ED visits.   Patient has a history of calculus of gallbladder and hypertension (per [place]).       Assessment: Received [employer] Hypertension Initiative referral on 07/09/2019.   Referral source: [employer]na.   Referral reason: " As a benefit of your [employer] plan, you have been chosen to participate in a blood pressure program. You will receive a blood pressure monitor in the mail with information to help you better manage your health."    Screening  follow up pending patient contact.      Plan: RNCM has sent unsuccessful outreach  letter, [facility] pamphlet, will call patient for 3rd telephone outreach attempt within 3 business days, screening follow up, and will proceed with case closure within 10 business days if no return call, after 4th unsuccessful outreach call.     Avarie Tavano H. Gardiner Barefoot, BSN, CCM [facility] Care Management [facility] Telephonic CM Phone: [phone] Fax: [phone]

## 2019-08-21 ENCOUNTER — Other Ambulatory Visit: Payer: Self-pay | Admitting: *Deleted

## 2019-08-21 NOTE — Patient Outreach (Signed)
[facility]) Care Management  08/21/2019  Sheila Baldwin 1970-06-24 [id number]   Subjective:Telephone call to patient's mobile number, no answer, left HIPAA compliant voicemail message, and requested call back.    Objective:Per KPN (Knowledge Performance Now, point of care tool) and chart review,patient has had no recent hospitalizations or ED visits. Patient has a history of calculus of gallbladder and hypertension (per [place]).     Assessment: Received [employer] Hypertension Initiative referral on 07/09/2019. Referral source: [employer]na. Referral reason: "As a benefit of your [employer] plan, you have been chosen to participate in a blood pressure program. You will receive a blood pressure monitor in the mail with information to help you better manage your health."Screening follow up pending patient contact.      Plan:RNCM has sent unsuccessful outreach letter, [facility] pamphlet, will call patient for 4th telephone outreach attempt within 30 business days, screening follow up, and will proceed with case closure, after 4th unsuccessful outreach call.     Yoshiye Kraft H. Gardiner Barefoot, BSN, CCM [facility] Care Management [facility] Telephonic CM Phone: [phone] Fax: [phone]

## 2019-08-31 DIAGNOSIS — H16101 Unspecified superficial keratitis, right eye: Secondary | ICD-10-CM | POA: Diagnosis not present

## 2019-09-03 DIAGNOSIS — E1169 Type 2 diabetes mellitus with other specified complication: Secondary | ICD-10-CM | POA: Diagnosis not present

## 2019-09-03 DIAGNOSIS — E669 Obesity, unspecified: Secondary | ICD-10-CM | POA: Diagnosis not present

## 2019-09-03 DIAGNOSIS — E782 Mixed hyperlipidemia: Secondary | ICD-10-CM | POA: Diagnosis not present

## 2019-09-09 DIAGNOSIS — H1011 Acute atopic conjunctivitis, right eye: Secondary | ICD-10-CM | POA: Diagnosis not present

## 2019-09-18 ENCOUNTER — Ambulatory Visit: Payer: Self-pay | Admitting: *Deleted

## 2019-09-19 ENCOUNTER — Ambulatory Visit: Payer: Self-pay | Admitting: *Deleted

## 2019-09-23 ENCOUNTER — Other Ambulatory Visit: Payer: Self-pay | Admitting: *Deleted

## 2019-09-23 NOTE — Patient Outreach (Signed)
[facility]) Care Management  09/23/2019  Sheila Baldwin 10-15-1970 [id number]   Subjective: Telephone call to patient's home  / mobile number, no answer, left HIPAA compliant voicemail message, and requested call back.    Objective:Per KPN (Knowledge Performance Now, point of care tool) and chart review,patient has had no recent hospitalizations or ED visits. Patient has a history of calculus of gallbladder and hypertension (per [place]).     Assessment: Received [employer] Hypertension Initiative referral on 07/09/2019. Referral source: [employer]na. Referral reason: "As a benefit of your [employer] plan, you have been chosen to participate in a blood pressure program. You will receive a blood pressure monitor in the mail with information to help you better manage your health."Screening follow up not completed due to unable to contact patient and will proceed with case closure.       Plan:Case closure due to unable to reach.  RNCM will send MD case closure letter.      Ethelean Colla H. Gardiner Barefoot, BSN, CCM [facility] Care Management [facility] Telephonic CM Phone: [phone] Fax: [phone]

## 2019-10-04 DIAGNOSIS — R079 Chest pain, unspecified: Secondary | ICD-10-CM | POA: Diagnosis not present

## 2019-10-04 DIAGNOSIS — R000 Tachycardia, unspecified: Secondary | ICD-10-CM | POA: Diagnosis not present

## 2019-10-04 DIAGNOSIS — I10 Essential (primary) hypertension: Secondary | ICD-10-CM | POA: Diagnosis not present

## 2019-10-04 DIAGNOSIS — M5416 Radiculopathy, lumbar region: Secondary | ICD-10-CM | POA: Diagnosis not present

## 2019-10-04 DIAGNOSIS — K219 Gastro-esophageal reflux disease without esophagitis: Secondary | ICD-10-CM | POA: Diagnosis not present

## 2019-10-28 DIAGNOSIS — K219 Gastro-esophageal reflux disease without esophagitis: Secondary | ICD-10-CM | POA: Diagnosis not present

## 2019-10-28 DIAGNOSIS — I10 Essential (primary) hypertension: Secondary | ICD-10-CM | POA: Diagnosis not present

## 2019-10-28 DIAGNOSIS — R0789 Other chest pain: Secondary | ICD-10-CM | POA: Diagnosis not present

## 2019-12-03 ENCOUNTER — Ambulatory Visit: Payer: 59 | Admitting: Cardiology

## 2019-12-03 ENCOUNTER — Encounter: Payer: Self-pay | Admitting: Cardiology

## 2019-12-03 ENCOUNTER — Other Ambulatory Visit: Payer: Self-pay

## 2019-12-03 VITALS — BP 138/68 | HR 82 | Ht 66.0 in | Wt 174.0 lb

## 2019-12-03 DIAGNOSIS — I209 Angina pectoris, unspecified: Secondary | ICD-10-CM | POA: Diagnosis not present

## 2019-12-03 DIAGNOSIS — M5416 Radiculopathy, lumbar region: Secondary | ICD-10-CM | POA: Diagnosis not present

## 2019-12-03 DIAGNOSIS — E7800 Pure hypercholesterolemia, unspecified: Secondary | ICD-10-CM

## 2019-12-03 DIAGNOSIS — R079 Chest pain, unspecified: Secondary | ICD-10-CM | POA: Diagnosis not present

## 2019-12-03 DIAGNOSIS — I10 Essential (primary) hypertension: Secondary | ICD-10-CM | POA: Diagnosis not present

## 2019-12-03 DIAGNOSIS — Z6836 Body mass index (BMI) 36.0-36.9, adult: Secondary | ICD-10-CM | POA: Diagnosis not present

## 2019-12-03 MED ORDER — ASPIRIN EC 81 MG PO TBEC: 81.0000 mg | DELAYED_RELEASE_TABLET | Freq: Every day | ORAL | 3 refills | Status: AC

## 2019-12-03 MED ORDER — METOPROLOL TARTRATE 100 MG PO TABS: 100.0000 mg | ORAL_TABLET | Freq: Once | ORAL | 0 refills | Status: AC

## 2019-12-03 NOTE — Patient Instructions (Signed)
Medication Instructions:  Please start Aspirin 81 mg a day. Continue all other medications as listed.  *If you need a refill on your cardiac medications before your next appointment, please call your pharmacy*  Testing/Procedures: Your physician has requested that you have an echocardiogram. Echocardiography is a painless test that uses sound waves to create images of your heart. It provides your doctor with information about the size and shape of your heart and how well your heart's chambers and valves are working. This procedure takes approximately one hour. There are no restrictions for this procedure.  Your cardiac CT will be scheduled at:   [facility] [address] [place], [place] [address] [phone]  Please arrive at the [facility] main entrance of [facility] 30 minutes prior to test start time. Proceed to the [facility] Radiology Department (first floor) to check-in and test prep.  Please follow these instructions carefully (unless otherwise directed):  On the Night Before the Test: . Be sure to Drink plenty of water. . Do not consume any caffeinated/decaffeinated beverages or chocolate 12 hours prior to your test. . Do not take any antihistamines 12 hours prior to your test.  On the Day of the Test: . Drink plenty of water. Do not drink any water within one hour of the test. . Do not eat any food 4 hours prior to the test. . You may take your regular medications prior to the test.  . Take metoprolol (Lopressor) two hours prior to test. . HOLD Furosemide/Hydrochlorothiazide morning of the test. . FEMALES- please wear underwire-free bra if available      After the Test: . Drink plenty of water. . After receiving IV contrast, you may experience a mild flushed feeling. This is normal. . On occasion, you may experience a mild rash up to 24 hours after the test. This is not dangerous. If this occurs, you can take Benadryl 25 mg and increase your  fluid intake. . If you experience trouble breathing, this can be serious. If it is severe call 911 IMMEDIATELY. If it is mild, please call our office.  Once we have confirmed authorization from your insurance company, we will call you to set up a date and time for your test. Based on how quickly your insurance processes prior authorizations requests, please allow up to 4 weeks to be contacted for scheduling your Cardiac CT appointment. Be advised that routine Cardiac CT appointments could be scheduled as many as 8 weeks after your provider has ordered it.  For non-scheduling related questions, please contact the cardiac imaging nurse navigator should you have any questions/concerns: Marchia Bond, Cardiac Imaging Nurse Navigator Burley Saver, Interim Cardiac Imaging Nurse [place] [facility] Direct Office Dial: [phone]   For scheduling needs, including cancellations and rescheduling, please call Vivien Rota at [phone], option 3.   Follow-Up: At [facility], you and your health needs are our priority.  As part of our continuing mission to provide you with exceptional heart care, we have created designated Provider Care Teams.  These Care Teams include your primary Cardiologist (physician) and Advanced Practice Providers (APPs -  Physician Assistants and Nurse Practitioners) who all work together to provide you with the care you need, when you need it.  We recommend signing up for the patient portal called "MyChart".  Sign up information is provided on this After Visit Summary.  MyChart is used to connect with patients for Virtual Visits (Telemedicine).  Patients are able to  view lab/test results, encounter notes, upcoming appointments, etc.  Non-urgent messages can be sent to your provider as well.   To learn more about what you can do with MyChart, go to [email or web address].    Follow will be determined after the above testing has been completed.  Thank you for  choosing [facility]!!

## 2019-12-03 NOTE — Progress Notes (Signed)
Cardiology Office Note:    Date:  12/03/2019   ID:  Sheila Baldwin, DOB [birth year], MRN [id number]  PCP:  Fanny Bien, MD  [place] Cardiologist:  No primary care provider on file.  [facility] Electrophysiologist:  None   Referring MD: Fanny Bien, MD     History of Present Illness:    Sheila Baldwin is a 49 y.o. female here for the evaluation of chest pain at the request of Dr. Rachell Cipro.  Dr. Ival Bible office note from 10/04/2019 reviewed she was describing chest discomfort with pressure centralized on exertion resolved with rest.  She had this when mowing her yard.  She felt sweaty.  Took about an hour to resolve.  Was requested to see [place] or go to the ER if symptoms become more worrisome.  Metoprolol succinate 50 mg was started. Elephant on chest.   After benicar only, without HCT, felt better.   Metoprolol made BP better.   She has comorbidities of hypertension.  Also has tachycardia longstanding her heart rate has been 90-100 for many years.  She also stated that the Benicar with HCT did not make her feel well.  Father died 48 sudden arrhythmia.   Hospice RN  Past Medical History:  Diagnosis Date  . Chest pain   . GERD (gastroesophageal reflux disease)   . Hypertension   . Tachycardia     Past Surgical History:  Procedure Laterality Date  . REDUCTION MAMMAPLASTY      Current Medications: Current Meds  Medication Sig  . Aspirin-Salicylamide-Caffeine [id number] MG PACK Take by mouth.  . cyclobenzaprine (FLEXERIL) 10 MG tablet at bedtime.  . gabapentin (NEURONTIN) 300 MG capsule Take 300 mg by mouth 3 (three) times daily.  . Melatonin 3 MG CAPS Take by mouth.  . metoprolol succinate (TOPROL-XL) 50 MG 24 hr tablet Take 50 mg by mouth daily.  . norethindrone (MICRONOR) 0.35 MG tablet Take 1 tablet by mouth daily.  Marland Kitchen olmesartan (BENICAR) 40 MG tablet Take 40 mg by mouth daily.  Marland Kitchen omeprazole (PRILOSEC) 20 MG capsule Take 20 mg by mouth daily.    . rosuvastatin (CRESTOR) 5 MG tablet Take 5 mg by mouth daily.     Allergies:   Doxycycline, Morphine and related, and Neosporin plus max st   Social History   Socioeconomic History  . Marital status: Single    Spouse name: Not on file  . Number of children: Not on file  . Years of education: Not on file  . Highest education level: Not on file  Occupational History  . Not on file  Tobacco Use  . Smoking status: Never Smoker  . Smokeless tobacco: Never Used  Substance and Sexual Activity  . Alcohol use: Not Currently  . Drug use: Never  . Sexual activity: Yes  Other Topics Concern  . Not on file  Social History Narrative  . Not on file   Social Determinants of Health   Financial Resource Strain:   . Difficulty of Paying Living Expenses: Not on file  Food Insecurity:   . Worried About Charity fundraiser in the Last Year: Not on file  . Ran Out of Food in the Last Year: Not on file  Transportation Needs:   . Lack of Transportation (Medical): Not on file  . Lack of Transportation (Non-Medical): Not on file  Physical Activity:   . Days of Exercise per Week: Not on file  . Minutes of Exercise per Session: Not on  file  Stress:   . Feeling of Stress : Not on file  Social Connections:   . Frequency of Communication with Friends and Family: Not on file  . Frequency of Social Gatherings with Friends and Family: Not on file  . Attends Religious Services: Not on file  . Active Member of Clubs or Organizations: Not on file  . Attends Club or Organization Meetings: Not on file  . Marital Status: Not on file     Family History: The patient's family history includes Arrhythmia (age of onset: 67) in her father.  ROS:   Please see the history of present illness.    No fevers chills nausea vomiting syncope bleeding all other systems reviewed and are negative.  EKGs/Labs/Other Studies Reviewed:    The following studies were reviewed today: none  EKG:  EKG is  ordered  today.  The ekg ordered today demonstrates sinus rhythm 82 no other specific abnormalities.  Recent Labs: No results found for requested labs within last 8760 hours.  Recent Lipid Panel No results found for: CHOL, TRIG, HDL, CHOLHDL, VLDL, LDLCALC, LDLDIRECT  Physical Exam:    VS:  BP 138/68   Pulse 82   Ht 5' 6" (1.676 m)   Wt 174 lb (78.9 kg)   SpO2 99%   BMI 28.08 kg/m     Wt Readings from Last 3 Encounters:  12/03/19 174 lb (78.9 kg)     GEN:  Well nourished, well developed in no acute distress HEENT: Normal NECK: No JVD; No carotid bruits LYMPHATICS: No lymphadenopathy CARDIAC: RRR, no murmurs, rubs, gallops RESPIRATORY:  Clear to auscultation without rales, wheezing or rhonchi  ABDOMEN: Soft, non-tender, non-distended MUSCULOSKELETAL:  No edema; No deformity  SKIN: Warm and dry NEUROLOGIC:  Alert and oriented x 3 PSYCHIATRIC:  Normal affect   ASSESSMENT:    1. Angina pectoris (HCC)   2. Chest pain of uncertain etiology   3. Essential hypertension   4. Chest pain, unspecified type   5. Pure hypercholesterolemia    PLAN:    In order of problems listed above:  Chest pain/angina -Worrisome symptoms, pressure-like, diaphoresis surrounding occurred with exertion relieved with rest. Thankfully, since starting the metoprolol and stopping the HCTZ she has not had any further symptoms. Her father died at age 67 from possible arrhythmia. -I would like to check an echocardiogram to ensure proper structure and function of her heart and to exclude hypertrophic cardiomyopathy features. -I will also check a coronary CT scan with possible FFR analysis evaluate her coronary arteries. -She is on metoprolol succinate 50 mg currently. Her heart rate is 82 on EKG. Her heart rate usually runs in the 90s to 100s. Because of this, we will give her metoprolol tartrate 100 mg in addition to her Toprol all succinate in the morning of 50 mg. -We will add low-dose aspirin 81 mg. -Knows to  seek medical assistance if problems become more worrisome.  Essential hypertension -Seems to be under better control with the addition of metoprolol. Continue with olmesartan 40 mg.  Hyperlipidemia -On low-dose Crestor 5 mg once a day. Prior LDL value in 2018 was 128. She has had this done more recently but I do not have the value currently. If we do see any evidence of coronary calcification or plaque, we will increase the Crestor to high intensity dose. She is willing to proceed with this.   Medication Adjustments/Labs and Tests Ordered: Current medicines are reviewed at length with the patient today.  Concerns regarding   medicines are outlined above.  Orders Placed This Encounter  Procedures  . CT CORONARY MORPH W/CTA COR W/SCORE W/CA W/CM &/OR WO/CM  . CT CORONARY FRACTIONAL FLOW RESERVE DATA PREP  . CT CORONARY FRACTIONAL FLOW RESERVE FLUID ANALYSIS  . EKG 12-Lead  . ECHOCARDIOGRAM COMPLETE   Meds ordered this encounter  Medications  . aspirin EC 81 MG tablet    Sig: Take 1 tablet (81 mg total) by mouth daily. Swallow whole.    Dispense:  90 tablet    Refill:  3  . metoprolol tartrate (LOPRESSOR) 100 MG tablet    Sig: Take 1 tablet (100 mg total) by mouth once for 1 dose. Take 1 tablet 2 hours before your CT scan    Dispense:  1 tablet    Refill:  0    Patient Instructions  Medication Instructions:  Please start Aspirin 81 mg a day. Continue all other medications as listed.  *If you need a refill on your cardiac medications before your next appointment, please call your pharmacy*  Testing/Procedures: Your physician has requested that you have an echocardiogram. Echocardiography is a painless test that uses sound waves to create images of your heart. It provides your doctor with information about the size and shape of your heart and how well your heart's chambers and valves are working. This procedure takes approximately one hour. There are no restrictions for this  procedure.  Your cardiac CT will be scheduled at:   [facility] [address] [place], [place] [address] [phone]  Please arrive at the [facility] main entrance of [facility] 30 minutes prior to test start time. Proceed to the [facility] (first floor) to check-in and test prep.  Please follow these instructions carefully (unless otherwise directed):  On the Night Before the Test: . Be sure to Drink plenty of water. . Do not consume any caffeinated/decaffeinated beverages or chocolate 12 hours prior to your test. . Do not take any antihistamines 12 hours prior to your test.  On the Day of the Test: . Drink plenty of water. Do not drink any water within one hour of the test. . Do not eat any food 4 hours prior to the test. . You may take your regular medications prior to the test.  . Take metoprolol (Lopressor) two hours prior to test. . HOLD Furosemide/Hydrochlorothiazide morning of the test. . FEMALES- please wear underwire-free bra if available      After the Test: . Drink plenty of water. . After receiving IV contrast, you may experience a mild flushed feeling. This is normal. . On occasion, you may experience a mild rash up to 24 hours after the test. This is not dangerous. If this occurs, you can take Benadryl 25 mg and increase your fluid intake. . If you experience trouble breathing, this can be serious. If it is severe call 911 IMMEDIATELY. If it is mild, please call our office.  Once we have confirmed authorization from your insurance company, we will call you to set up a date and time for your test. Based on how quickly your insurance processes prior authorizations requests, please allow up to 4 weeks to be contacted for scheduling your Cardiac CT appointment. Be advised that routine Cardiac CT appointments could be scheduled as many as 8 weeks after your provider has ordered it.  For non-scheduling related questions,  please contact the cardiac imaging nurse navigator should you have any questions/concerns: Sara Wallace, Cardiac Imaging Nurse   Navigator Merle Tai, Interim Cardiac Imaging Nurse Navigator [facility] Direct Office Dial: [phone]   For scheduling needs, including cancellations and rescheduling, please call Toni at [phone], option [phone].   Follow-Up: At [facility], you and your health needs are our priority.  As part of our continuing mission to provide you with exceptional heart care, we have created designated Provider Care Teams.  These Care Teams include your primary Cardiologist (physician) and Advanced Practice Providers (APPs -  Physician Assistants and Nurse Practitioners) who all work together to provide you with the care you need, when you need it.  We recommend signing up for the patient portal called "MyChart".  Sign up information is provided on this After Visit Summary.  MyChart is used to connect with patients for Virtual Visits (Telemedicine).  Patients are able to view lab/test results, encounter notes, upcoming appointments, etc.  Non-urgent messages can be sent to your provider as well.   To learn more about what you can do with MyChart, go to [email or web address].    Follow will be determined after the above testing has been completed.  Thank you for choosing [facility]!!        Signed, Mark Skains, MD  12/03/2019 10:34 AM    [facility] 

## 2019-12-16 DIAGNOSIS — M9904 Segmental and somatic dysfunction of sacral region: Secondary | ICD-10-CM | POA: Diagnosis not present

## 2019-12-16 DIAGNOSIS — M62830 Muscle spasm of back: Secondary | ICD-10-CM | POA: Diagnosis not present

## 2019-12-16 DIAGNOSIS — M4606 Spinal enthesopathy, lumbar region: Secondary | ICD-10-CM | POA: Diagnosis not present

## 2019-12-16 DIAGNOSIS — M609 Myositis, unspecified: Secondary | ICD-10-CM | POA: Diagnosis not present

## 2019-12-16 DIAGNOSIS — M5117 Intervertebral disc disorders with radiculopathy, lumbosacral region: Secondary | ICD-10-CM | POA: Diagnosis not present

## 2019-12-16 DIAGNOSIS — M9902 Segmental and somatic dysfunction of thoracic region: Secondary | ICD-10-CM | POA: Diagnosis not present

## 2019-12-19 ENCOUNTER — Ambulatory Visit (HOSPITAL_COMMUNITY): Payer: 59 | Attending: Internal Medicine

## 2019-12-19 ENCOUNTER — Other Ambulatory Visit: Payer: Self-pay

## 2019-12-19 DIAGNOSIS — R079 Chest pain, unspecified: Secondary | ICD-10-CM | POA: Insufficient documentation

## 2019-12-19 DIAGNOSIS — I10 Essential (primary) hypertension: Secondary | ICD-10-CM | POA: Insufficient documentation

## 2019-12-19 LAB — ECHOCARDIOGRAM COMPLETE
Area-P 1/2: 3.34 cm2
S' Lateral: 2.1 cm

## 2019-12-20 DIAGNOSIS — M5416 Radiculopathy, lumbar region: Secondary | ICD-10-CM | POA: Diagnosis not present

## 2019-12-24 DIAGNOSIS — M9902 Segmental and somatic dysfunction of thoracic region: Secondary | ICD-10-CM | POA: Diagnosis not present

## 2019-12-24 DIAGNOSIS — M4606 Spinal enthesopathy, lumbar region: Secondary | ICD-10-CM | POA: Diagnosis not present

## 2019-12-24 DIAGNOSIS — M9904 Segmental and somatic dysfunction of sacral region: Secondary | ICD-10-CM | POA: Diagnosis not present

## 2019-12-24 DIAGNOSIS — M609 Myositis, unspecified: Secondary | ICD-10-CM | POA: Diagnosis not present

## 2019-12-24 DIAGNOSIS — M62830 Muscle spasm of back: Secondary | ICD-10-CM | POA: Diagnosis not present

## 2019-12-24 DIAGNOSIS — M5117 Intervertebral disc disorders with radiculopathy, lumbosacral region: Secondary | ICD-10-CM | POA: Diagnosis not present

## 2019-12-25 DIAGNOSIS — M62830 Muscle spasm of back: Secondary | ICD-10-CM | POA: Diagnosis not present

## 2019-12-25 DIAGNOSIS — M609 Myositis, unspecified: Secondary | ICD-10-CM | POA: Diagnosis not present

## 2019-12-25 DIAGNOSIS — M5117 Intervertebral disc disorders with radiculopathy, lumbosacral region: Secondary | ICD-10-CM | POA: Diagnosis not present

## 2019-12-25 DIAGNOSIS — M9904 Segmental and somatic dysfunction of sacral region: Secondary | ICD-10-CM | POA: Diagnosis not present

## 2019-12-25 DIAGNOSIS — M9902 Segmental and somatic dysfunction of thoracic region: Secondary | ICD-10-CM | POA: Diagnosis not present

## 2019-12-25 DIAGNOSIS — M4606 Spinal enthesopathy, lumbar region: Secondary | ICD-10-CM | POA: Diagnosis not present

## 2019-12-26 ENCOUNTER — Other Ambulatory Visit: Payer: Self-pay | Admitting: Cardiology

## 2019-12-26 DIAGNOSIS — M4606 Spinal enthesopathy, lumbar region: Secondary | ICD-10-CM | POA: Diagnosis not present

## 2019-12-26 DIAGNOSIS — M9902 Segmental and somatic dysfunction of thoracic region: Secondary | ICD-10-CM | POA: Diagnosis not present

## 2019-12-26 DIAGNOSIS — M9904 Segmental and somatic dysfunction of sacral region: Secondary | ICD-10-CM | POA: Diagnosis not present

## 2019-12-26 DIAGNOSIS — M62830 Muscle spasm of back: Secondary | ICD-10-CM | POA: Diagnosis not present

## 2019-12-26 DIAGNOSIS — M609 Myositis, unspecified: Secondary | ICD-10-CM | POA: Diagnosis not present

## 2019-12-26 DIAGNOSIS — M5117 Intervertebral disc disorders with radiculopathy, lumbosacral region: Secondary | ICD-10-CM | POA: Diagnosis not present

## 2019-12-27 DIAGNOSIS — Z0000 Encounter for general adult medical examination without abnormal findings: Secondary | ICD-10-CM | POA: Diagnosis not present

## 2019-12-27 DIAGNOSIS — Z1159 Encounter for screening for other viral diseases: Secondary | ICD-10-CM | POA: Diagnosis not present

## 2019-12-30 DIAGNOSIS — Z0000 Encounter for general adult medical examination without abnormal findings: Secondary | ICD-10-CM | POA: Diagnosis not present

## 2019-12-30 DIAGNOSIS — M5117 Intervertebral disc disorders with radiculopathy, lumbosacral region: Secondary | ICD-10-CM | POA: Diagnosis not present

## 2019-12-30 DIAGNOSIS — M9902 Segmental and somatic dysfunction of thoracic region: Secondary | ICD-10-CM | POA: Diagnosis not present

## 2019-12-30 DIAGNOSIS — M9904 Segmental and somatic dysfunction of sacral region: Secondary | ICD-10-CM | POA: Diagnosis not present

## 2019-12-30 DIAGNOSIS — M62830 Muscle spasm of back: Secondary | ICD-10-CM | POA: Diagnosis not present

## 2019-12-30 DIAGNOSIS — M4606 Spinal enthesopathy, lumbar region: Secondary | ICD-10-CM | POA: Diagnosis not present

## 2019-12-30 DIAGNOSIS — M609 Myositis, unspecified: Secondary | ICD-10-CM | POA: Diagnosis not present

## 2020-01-22 DIAGNOSIS — M62830 Muscle spasm of back: Secondary | ICD-10-CM | POA: Diagnosis not present

## 2020-01-22 DIAGNOSIS — M9904 Segmental and somatic dysfunction of sacral region: Secondary | ICD-10-CM | POA: Diagnosis not present

## 2020-01-22 DIAGNOSIS — M609 Myositis, unspecified: Secondary | ICD-10-CM | POA: Diagnosis not present

## 2020-01-22 DIAGNOSIS — M4606 Spinal enthesopathy, lumbar region: Secondary | ICD-10-CM | POA: Diagnosis not present

## 2020-01-22 DIAGNOSIS — M5117 Intervertebral disc disorders with radiculopathy, lumbosacral region: Secondary | ICD-10-CM | POA: Diagnosis not present

## 2020-01-22 DIAGNOSIS — M9902 Segmental and somatic dysfunction of thoracic region: Secondary | ICD-10-CM | POA: Diagnosis not present

## 2020-01-23 DIAGNOSIS — M4606 Spinal enthesopathy, lumbar region: Secondary | ICD-10-CM | POA: Diagnosis not present

## 2020-01-23 DIAGNOSIS — M609 Myositis, unspecified: Secondary | ICD-10-CM | POA: Diagnosis not present

## 2020-01-23 DIAGNOSIS — M9904 Segmental and somatic dysfunction of sacral region: Secondary | ICD-10-CM | POA: Diagnosis not present

## 2020-01-23 DIAGNOSIS — M9902 Segmental and somatic dysfunction of thoracic region: Secondary | ICD-10-CM | POA: Diagnosis not present

## 2020-01-23 DIAGNOSIS — M5117 Intervertebral disc disorders with radiculopathy, lumbosacral region: Secondary | ICD-10-CM | POA: Diagnosis not present

## 2020-01-23 DIAGNOSIS — M62830 Muscle spasm of back: Secondary | ICD-10-CM | POA: Diagnosis not present

## 2020-01-24 DIAGNOSIS — M62830 Muscle spasm of back: Secondary | ICD-10-CM | POA: Diagnosis not present

## 2020-01-24 DIAGNOSIS — M4606 Spinal enthesopathy, lumbar region: Secondary | ICD-10-CM | POA: Diagnosis not present

## 2020-01-24 DIAGNOSIS — M9904 Segmental and somatic dysfunction of sacral region: Secondary | ICD-10-CM | POA: Diagnosis not present

## 2020-01-24 DIAGNOSIS — M609 Myositis, unspecified: Secondary | ICD-10-CM | POA: Diagnosis not present

## 2020-01-24 DIAGNOSIS — M9902 Segmental and somatic dysfunction of thoracic region: Secondary | ICD-10-CM | POA: Diagnosis not present

## 2020-01-24 DIAGNOSIS — M5117 Intervertebral disc disorders with radiculopathy, lumbosacral region: Secondary | ICD-10-CM | POA: Diagnosis not present

## 2020-01-28 DIAGNOSIS — M4606 Spinal enthesopathy, lumbar region: Secondary | ICD-10-CM | POA: Diagnosis not present

## 2020-01-28 DIAGNOSIS — M9902 Segmental and somatic dysfunction of thoracic region: Secondary | ICD-10-CM | POA: Diagnosis not present

## 2020-01-28 DIAGNOSIS — M9904 Segmental and somatic dysfunction of sacral region: Secondary | ICD-10-CM | POA: Diagnosis not present

## 2020-01-28 DIAGNOSIS — M609 Myositis, unspecified: Secondary | ICD-10-CM | POA: Diagnosis not present

## 2020-01-28 DIAGNOSIS — M5117 Intervertebral disc disorders with radiculopathy, lumbosacral region: Secondary | ICD-10-CM | POA: Diagnosis not present

## 2020-01-28 DIAGNOSIS — M62830 Muscle spasm of back: Secondary | ICD-10-CM | POA: Diagnosis not present

## 2020-01-30 DIAGNOSIS — M9902 Segmental and somatic dysfunction of thoracic region: Secondary | ICD-10-CM | POA: Diagnosis not present

## 2020-01-30 DIAGNOSIS — M62830 Muscle spasm of back: Secondary | ICD-10-CM | POA: Diagnosis not present

## 2020-01-30 DIAGNOSIS — M4606 Spinal enthesopathy, lumbar region: Secondary | ICD-10-CM | POA: Diagnosis not present

## 2020-01-30 DIAGNOSIS — M5117 Intervertebral disc disorders with radiculopathy, lumbosacral region: Secondary | ICD-10-CM | POA: Diagnosis not present

## 2020-01-30 DIAGNOSIS — M609 Myositis, unspecified: Secondary | ICD-10-CM | POA: Diagnosis not present

## 2020-01-30 DIAGNOSIS — M9904 Segmental and somatic dysfunction of sacral region: Secondary | ICD-10-CM | POA: Diagnosis not present

## 2020-01-30 DIAGNOSIS — M5126 Other intervertebral disc displacement, lumbar region: Secondary | ICD-10-CM | POA: Diagnosis not present

## 2020-02-05 DIAGNOSIS — M9904 Segmental and somatic dysfunction of sacral region: Secondary | ICD-10-CM | POA: Diagnosis not present

## 2020-02-05 DIAGNOSIS — M4606 Spinal enthesopathy, lumbar region: Secondary | ICD-10-CM | POA: Diagnosis not present

## 2020-02-05 DIAGNOSIS — M609 Myositis, unspecified: Secondary | ICD-10-CM | POA: Diagnosis not present

## 2020-02-05 DIAGNOSIS — M62830 Muscle spasm of back: Secondary | ICD-10-CM | POA: Diagnosis not present

## 2020-02-05 DIAGNOSIS — M9902 Segmental and somatic dysfunction of thoracic region: Secondary | ICD-10-CM | POA: Diagnosis not present

## 2020-02-05 DIAGNOSIS — M5117 Intervertebral disc disorders with radiculopathy, lumbosacral region: Secondary | ICD-10-CM | POA: Diagnosis not present

## 2020-02-13 DIAGNOSIS — M545 Low back pain, unspecified: Secondary | ICD-10-CM | POA: Diagnosis not present

## 2020-02-13 DIAGNOSIS — M62830 Muscle spasm of back: Secondary | ICD-10-CM | POA: Diagnosis not present

## 2020-02-13 DIAGNOSIS — M5117 Intervertebral disc disorders with radiculopathy, lumbosacral region: Secondary | ICD-10-CM | POA: Diagnosis not present

## 2020-02-13 DIAGNOSIS — M9902 Segmental and somatic dysfunction of thoracic region: Secondary | ICD-10-CM | POA: Diagnosis not present

## 2020-02-13 DIAGNOSIS — M9904 Segmental and somatic dysfunction of sacral region: Secondary | ICD-10-CM | POA: Diagnosis not present

## 2020-02-13 DIAGNOSIS — M4606 Spinal enthesopathy, lumbar region: Secondary | ICD-10-CM | POA: Diagnosis not present

## 2020-02-13 DIAGNOSIS — M609 Myositis, unspecified: Secondary | ICD-10-CM | POA: Diagnosis not present

## 2020-02-18 DIAGNOSIS — M5416 Radiculopathy, lumbar region: Secondary | ICD-10-CM | POA: Diagnosis not present

## 2020-02-19 DIAGNOSIS — M609 Myositis, unspecified: Secondary | ICD-10-CM | POA: Diagnosis not present

## 2020-02-19 DIAGNOSIS — M5117 Intervertebral disc disorders with radiculopathy, lumbosacral region: Secondary | ICD-10-CM | POA: Diagnosis not present

## 2020-02-19 DIAGNOSIS — M9904 Segmental and somatic dysfunction of sacral region: Secondary | ICD-10-CM | POA: Diagnosis not present

## 2020-02-19 DIAGNOSIS — M62830 Muscle spasm of back: Secondary | ICD-10-CM | POA: Diagnosis not present

## 2020-02-19 DIAGNOSIS — M4606 Spinal enthesopathy, lumbar region: Secondary | ICD-10-CM | POA: Diagnosis not present

## 2020-02-19 DIAGNOSIS — M9902 Segmental and somatic dysfunction of thoracic region: Secondary | ICD-10-CM | POA: Diagnosis not present

## 2020-02-20 DIAGNOSIS — M9904 Segmental and somatic dysfunction of sacral region: Secondary | ICD-10-CM | POA: Diagnosis not present

## 2020-02-20 DIAGNOSIS — M9902 Segmental and somatic dysfunction of thoracic region: Secondary | ICD-10-CM | POA: Diagnosis not present

## 2020-02-20 DIAGNOSIS — M4606 Spinal enthesopathy, lumbar region: Secondary | ICD-10-CM | POA: Diagnosis not present

## 2020-02-20 DIAGNOSIS — M5117 Intervertebral disc disorders with radiculopathy, lumbosacral region: Secondary | ICD-10-CM | POA: Diagnosis not present

## 2020-02-20 DIAGNOSIS — M62830 Muscle spasm of back: Secondary | ICD-10-CM | POA: Diagnosis not present

## 2020-02-20 DIAGNOSIS — M609 Myositis, unspecified: Secondary | ICD-10-CM | POA: Diagnosis not present

## 2020-03-11 DIAGNOSIS — M5416 Radiculopathy, lumbar region: Secondary | ICD-10-CM | POA: Diagnosis not present

## 2020-03-23 DIAGNOSIS — M5416 Radiculopathy, lumbar region: Secondary | ICD-10-CM | POA: Diagnosis not present

## 2020-04-03 DIAGNOSIS — Z20822 Contact with and (suspected) exposure to covid-19: Secondary | ICD-10-CM | POA: Diagnosis not present

## 2020-04-06 DIAGNOSIS — U071 COVID-19: Secondary | ICD-10-CM | POA: Diagnosis not present

## 2020-04-06 DIAGNOSIS — M5416 Radiculopathy, lumbar region: Secondary | ICD-10-CM | POA: Diagnosis not present

## 2020-04-06 DIAGNOSIS — J019 Acute sinusitis, unspecified: Secondary | ICD-10-CM | POA: Diagnosis not present

## 2021-01-06 ENCOUNTER — Other Ambulatory Visit: Payer: Self-pay | Admitting: Family Medicine

## 2021-01-06 DIAGNOSIS — Z1231 Encounter for screening mammogram for malignant neoplasm of breast: Secondary | ICD-10-CM

## 2021-02-24 ENCOUNTER — Ambulatory Visit
Admission: RE | Admit: 2021-02-24 | Discharge: 2021-02-24 | Disposition: A | Discharge status: Home / Self Care | Payer: 59 | Source: Ambulatory Visit | Attending: Family Medicine | Admitting: Family Medicine

## 2021-02-24 ENCOUNTER — Other Ambulatory Visit: Payer: Self-pay

## 2021-02-24 DIAGNOSIS — Z1231 Encounter for screening mammogram for malignant neoplasm of breast: Secondary | ICD-10-CM

## 2022-01-04 ENCOUNTER — Other Ambulatory Visit: Payer: Self-pay | Admitting: Family Medicine

## 2022-01-04 DIAGNOSIS — Z1231 Encounter for screening mammogram for malignant neoplasm of breast: Secondary | ICD-10-CM

## 2022-04-11 ENCOUNTER — Other Ambulatory Visit: Payer: Self-pay | Admitting: Family Medicine

## 2022-04-11 DIAGNOSIS — Z1231 Encounter for screening mammogram for malignant neoplasm of breast: Secondary | ICD-10-CM

## 2022-05-26 DIAGNOSIS — Z1231 Encounter for screening mammogram for malignant neoplasm of breast: Secondary | ICD-10-CM

## 2022-10-12 DIAGNOSIS — N951 Menopausal and female climacteric states: Secondary | ICD-10-CM | POA: Diagnosis not present

## 2022-10-12 DIAGNOSIS — F411 Generalized anxiety disorder: Secondary | ICD-10-CM | POA: Diagnosis not present

## 2022-10-12 DIAGNOSIS — G4700 Insomnia, unspecified: Secondary | ICD-10-CM | POA: Diagnosis not present

## 2022-10-12 DIAGNOSIS — F331 Major depressive disorder, recurrent, moderate: Secondary | ICD-10-CM | POA: Diagnosis not present

## 2022-11-22 DIAGNOSIS — F331 Major depressive disorder, recurrent, moderate: Secondary | ICD-10-CM | POA: Diagnosis not present

## 2022-11-22 DIAGNOSIS — F411 Generalized anxiety disorder: Secondary | ICD-10-CM | POA: Diagnosis not present

## 2022-11-22 DIAGNOSIS — G4700 Insomnia, unspecified: Secondary | ICD-10-CM | POA: Diagnosis not present

## 2023-05-12 DIAGNOSIS — E785 Hyperlipidemia, unspecified: Secondary | ICD-10-CM | POA: Diagnosis not present

## 2023-05-12 DIAGNOSIS — E559 Vitamin D deficiency, unspecified: Secondary | ICD-10-CM | POA: Diagnosis not present

## 2023-05-12 DIAGNOSIS — R5383 Other fatigue: Secondary | ICD-10-CM | POA: Diagnosis not present

## 2023-05-15 DIAGNOSIS — I10 Essential (primary) hypertension: Secondary | ICD-10-CM | POA: Diagnosis not present

## 2023-05-15 DIAGNOSIS — E782 Mixed hyperlipidemia: Secondary | ICD-10-CM | POA: Diagnosis not present

## 2023-05-15 DIAGNOSIS — F411 Generalized anxiety disorder: Secondary | ICD-10-CM | POA: Diagnosis not present

## 2023-05-15 DIAGNOSIS — G4700 Insomnia, unspecified: Secondary | ICD-10-CM | POA: Diagnosis not present
